# Patient Record
Sex: Male | Born: 1990 | Race: Black or African American | Hispanic: No | Marital: Single | Smoking: Current some day smoker
Health system: Southern US, Community
[De-identification: ages and names within clinical notes are randomized; demographics above are authoritative.]

## PROBLEM LIST (undated history)

## (undated) DIAGNOSIS — J45909 Unspecified asthma, uncomplicated: Secondary | ICD-10-CM

---
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
## 2014-08-13 ENCOUNTER — Encounter (HOSPITAL_COMMUNITY): Payer: Self-pay

## 2014-08-13 ENCOUNTER — Emergency Department (INDEPENDENT_AMBULATORY_CARE_PROVIDER_SITE_OTHER): Payer: Self-pay

## 2014-08-13 ENCOUNTER — Emergency Department (INDEPENDENT_AMBULATORY_CARE_PROVIDER_SITE_OTHER)
Admission: EM | Admit: 2014-08-13 | Discharge: 2014-08-13 | Disposition: A | Discharge status: Home / Self Care | Payer: Self-pay | Source: Home / Self Care | Attending: Family Medicine | Admitting: Family Medicine

## 2014-08-13 DIAGNOSIS — W503XXA Accidental bite by another person, initial encounter: Secondary | ICD-10-CM

## 2014-08-13 DIAGNOSIS — Z23 Encounter for immunization: Secondary | ICD-10-CM

## 2014-08-13 DIAGNOSIS — S61259A Open bite of unspecified finger without damage to nail, initial encounter: Secondary | ICD-10-CM

## 2014-08-13 HISTORY — DX: Unspecified asthma, uncomplicated: J45.909

## 2014-08-13 MED ORDER — TETANUS-DIPHTH-ACELL PERTUSSIS 5-2.5-18.5 LF-MCG/0.5 IM SUSP
INTRAMUSCULAR | Status: AC
  Filled 2014-08-13: qty 0.5

## 2014-08-13 MED ORDER — TETANUS-DIPHTH-ACELL PERTUSSIS 5-2.5-18.5 LF-MCG/0.5 IM SUSP
0.5000 mL | Freq: Once | INTRAMUSCULAR | Status: AC
  Administered 2014-08-13: 0.5 mL via INTRAMUSCULAR

## 2014-08-13 MED ORDER — DICLOFENAC SODIUM 75 MG PO TBEC: 75.0000 mg | DELAYED_RELEASE_TABLET | Freq: Two times a day (BID) | ORAL | Status: AC | PRN

## 2014-08-13 MED ORDER — AMOXICILLIN-POT CLAVULANATE 875-125 MG PO TABS: 1.0000 | ORAL_TABLET | Freq: Two times a day (BID) | ORAL | Status: AC

## 2014-08-13 NOTE — Discharge Instructions (Signed)
Thank you for coming in today. Follow up with [facility] occupational health  [address], [place], [place] [address] [phone] Take Augmentin twice daily for one week. Take diclofenac twice daily for pain control. Return as needed.  Human Bite Human bite wounds tend to become infected, even when they seem minor at first. Bite wounds of the hand can be serious because the tendons and joints are close to the skin. Infection can develop very rapidly, even in a matter of hours.  DIAGNOSIS  Your caregiver will most likely:  Take a detailed history of the bite injury.  Perform a wound exam.  Take your medical history. Blood tests or X-rays may be performed. Sometimes, infected bite wounds are cultured and sent to a lab to identify the infectious bacteria. TREATMENT  Medical treatment will depend on the location of the bite as well as the patient's medical history. Treatment may include:  Wound care, such as cleaning and flushing the wound with saline solution, bandaging, and elevating the affected area.  Antibiotic medicine.  Tetanus immunization.  Leaving the wound open to heal. This is often done with human bites due to the high risk of infection. However, in certain cases, wound closure with stitches, wound adhesive, skin adhesive strips, or staples may be used. Infected bites that are left untreated may require intravenous (IV) antibiotics and surgical treatment in the hospital. HOME CARE INSTRUCTIONS  Follow your caregiver's instructions for wound care.  Take all medicines as directed.  If your caregiver prescribes antibiotics, take them as directed. Finish them even if you start to feel better.  Follow up with your caregiver for further exams or immunizations as directed. You may need a tetanus shot if:  You cannot remember when you had your last tetanus shot.  You have never had a tetanus shot.  The injury broke your skin. If you get a tetanus shot,  your arm may swell, get red, and feel warm to the touch. This is common and not a problem. If you need a tetanus shot and you choose not to have one, there is a rare chance of getting tetanus. Sickness from tetanus can be serious. SEEK IMMEDIATE MEDICAL CARE IF:  You have increased pain, swelling, or redness around the bite wound.  You have chills.  You have a fever.  You have pus draining from the wound.  You have red streaks on the skin coming from the wound.  You have pain with movement or trouble moving the injured part.  You are not improving, or you are getting worse.  You have any other questions or concerns. MAKE SURE YOU:  Understand these instructions.  Will watch your condition.  Will get help right away if you are not doing well or get worse. Document Released: 06/16/2004 Document Revised: 08/01/2011 Document Reviewed: 12/29/2010 [facility] Patient Information 2015 [place] [place]. This information is not intended to replace advice given to you by your health care provider. Make sure you discuss any questions you have with your health care provider.

## 2014-08-13 NOTE — ED Notes (Signed)
Buddy taped 4th and 5th digit; placed on splint
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
## 2014-08-13 NOTE — ED Notes (Signed)
Ice pack home w patient
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
## 2014-08-17 NOTE — ED Notes (Signed)
Presented w request for RTW note . Was advised on d/c to follow up w Occupational Health, but failed to do so. Per Dr Denyse Amassorey, who was provider on day of [facility] visit, he will need to see Occupational Health for a work related injury RTW work note
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
## 2016-08-05 ENCOUNTER — Encounter (HOSPITAL_COMMUNITY): Payer: Self-pay

## 2016-08-05 ENCOUNTER — Emergency Department (HOSPITAL_COMMUNITY)
Admission: EM | Admit: 2016-08-05 | Discharge: 2016-08-05 | Disposition: A | Discharge status: Home / Self Care | Payer: Managed Care, Other (non HMO) | Attending: Emergency Medicine | Admitting: Emergency Medicine

## 2016-08-05 ENCOUNTER — Emergency Department (HOSPITAL_COMMUNITY): Payer: Managed Care, Other (non HMO)

## 2016-08-05 DIAGNOSIS — M25512 Pain in left shoulder: Secondary | ICD-10-CM | POA: Insufficient documentation

## 2016-08-05 DIAGNOSIS — M545 Low back pain, unspecified: Secondary | ICD-10-CM

## 2016-08-05 DIAGNOSIS — Y92410 Unspecified street and highway as the place of occurrence of the external cause: Secondary | ICD-10-CM | POA: Diagnosis not present

## 2016-08-05 DIAGNOSIS — F17200 Nicotine dependence, unspecified, uncomplicated: Secondary | ICD-10-CM | POA: Diagnosis not present

## 2016-08-05 DIAGNOSIS — Y939 Activity, unspecified: Secondary | ICD-10-CM | POA: Diagnosis not present

## 2016-08-05 DIAGNOSIS — J45909 Unspecified asthma, uncomplicated: Secondary | ICD-10-CM | POA: Diagnosis not present

## 2016-08-05 DIAGNOSIS — Y999 Unspecified external cause status: Secondary | ICD-10-CM | POA: Insufficient documentation

## 2016-08-05 MED ORDER — NAPROXEN 375 MG PO TABS: 375.0000 mg | ORAL_TABLET | Freq: Two times a day (BID) | ORAL | 0 refills | Status: AC

## 2016-08-05 MED ORDER — KETOROLAC TROMETHAMINE 30 MG/ML IJ SOLN
30.0000 mg | Freq: Once | INTRAMUSCULAR | Status: AC
  Administered 2016-08-05: 30 mg via INTRAMUSCULAR
  Filled 2016-08-05: qty 1

## 2016-08-05 MED ORDER — METHOCARBAMOL 500 MG PO TABS: 500.0000 mg | ORAL_TABLET | Freq: Two times a day (BID) | ORAL | 0 refills | Status: AC

## 2016-08-05 NOTE — ED Notes (Signed)
Returns from xray

## 2016-08-05 NOTE — ED Notes (Signed)
Pt states he understands instructions. Home stable with steady gait,.

## 2016-08-05 NOTE — ED Notes (Signed)
ED Provider at bedside. 
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
## 2016-08-05 NOTE — ED Notes (Signed)
BP cuff noted at left elbow and repositioned and arm which was on rail lowered to side.

## 2016-08-05 NOTE — Discharge Instructions (Signed)
All of your imaging has been normal. This is likely musculoskeletal pain. Please take the naproxen for pain. Do not take any extra Aleve/ibuprofen/Advil/Motrin with this medication. May take Tylenol. Have given the Robaxin which is a muscle relaxer. This medication make you drowsy. He had to the affected area. Warm soaks in the bath with Epson salt will help with muscle aches. If you feel like you're not improving in 2-3 days please follow-up with her primary care doctor or return to the ED. Return sooner if he develops any worsening symptoms. Have given-year-old follow-up to orthopedics if your symptoms not improve.  SEEK IMMEDIATE MEDICAL ATTENTION IF: New numbness, tingling, weakness, or problem with the use of your arms or legs.  Severe back pain not relieved with medications.  Change in bowel or bladder control.  Urinary retention.  Numbness in your groin.  Increasing pain in any areas of the body (such as chest or abdominal pain).  Shortness of breath, dizziness or fainting.  Nausea (feeling sick to your stomach), vomiting, fever, or sweats.

## 2016-08-05 NOTE — ED Notes (Signed)
Patient transported to X-ray 
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
## 2016-08-08 ENCOUNTER — Ambulatory Visit: Payer: Self-pay | Admitting: Medical
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
